# Patient Record
Sex: Male | Born: 1970 | Race: White | Hispanic: No | Marital: Married | Smoking: Former smoker
Health system: Southern US, Community
[De-identification: ages and names within clinical notes are randomized; demographics above are authoritative.]

## PROBLEM LIST (undated history)

## (undated) HISTORY — PX: HERNIA REPAIR: SHX51

---

## 2008-09-08 ENCOUNTER — Ambulatory Visit: Payer: Self-pay | Admitting: Surgery

## 2017-07-15 ENCOUNTER — Emergency Department: Payer: BLUE CROSS/BLUE SHIELD

## 2017-07-15 ENCOUNTER — Emergency Department
Admission: EM | Admit: 2017-07-15 | Discharge: 2017-07-16 | Disposition: A | Discharge status: Other Acute Inpatient Hospital | Payer: BLUE CROSS/BLUE SHIELD | Attending: Emergency Medicine | Admitting: Emergency Medicine

## 2017-07-15 ENCOUNTER — Encounter: Payer: Self-pay | Admitting: Emergency Medicine

## 2017-07-15 DIAGNOSIS — S022XXA Fracture of nasal bones, initial encounter for closed fracture: Secondary | ICD-10-CM | POA: Insufficient documentation

## 2017-07-15 DIAGNOSIS — S0121XA Laceration without foreign body of nose, initial encounter: Secondary | ICD-10-CM

## 2017-07-15 DIAGNOSIS — S0240DA Maxillary fracture, left side, initial encounter for closed fracture: Secondary | ICD-10-CM | POA: Insufficient documentation

## 2017-07-15 DIAGNOSIS — S0993XA Unspecified injury of face, initial encounter: Secondary | ICD-10-CM | POA: Diagnosis present

## 2017-07-15 DIAGNOSIS — Z79899 Other long term (current) drug therapy: Secondary | ICD-10-CM | POA: Diagnosis not present

## 2017-07-15 DIAGNOSIS — Z87891 Personal history of nicotine dependence: Secondary | ICD-10-CM | POA: Insufficient documentation

## 2017-07-15 DIAGNOSIS — S0232XA Fracture of orbital floor, left side, initial encounter for closed fracture: Secondary | ICD-10-CM | POA: Diagnosis not present

## 2017-07-15 DIAGNOSIS — Y929 Unspecified place or not applicable: Secondary | ICD-10-CM | POA: Diagnosis not present

## 2017-07-15 DIAGNOSIS — S0231XA Fracture of orbital floor, right side, initial encounter for closed fracture: Secondary | ICD-10-CM | POA: Diagnosis not present

## 2017-07-15 DIAGNOSIS — S0292XA Unspecified fracture of facial bones, initial encounter for closed fracture: Secondary | ICD-10-CM

## 2017-07-15 DIAGNOSIS — Y9389 Activity, other specified: Secondary | ICD-10-CM | POA: Insufficient documentation

## 2017-07-15 DIAGNOSIS — W11XXXA Fall on and from ladder, initial encounter: Secondary | ICD-10-CM | POA: Diagnosis not present

## 2017-07-15 DIAGNOSIS — Y999 Unspecified external cause status: Secondary | ICD-10-CM | POA: Diagnosis not present

## 2017-07-15 MED ORDER — ACETAMINOPHEN 500 MG PO TABS
1000.0000 mg | ORAL_TABLET | Freq: Once | ORAL | Status: AC
  Administered 2017-07-15: 1000 mg via ORAL
  Filled 2017-07-15: qty 2

## 2017-07-15 NOTE — ED Triage Notes (Addendum)
Pt arrives to ED 14 via EMS from home c/o mechanical fall and "face planted my nose on the bathtub" pt has an apparent injury to the nose, with bleeding that is controlled; per EMS, pt also experienced dizziness and orthostatic BP changes. Pt is awake, alert and oriented at this time. Per EMS pt has had a "few drinks" since about 1700 today. Pt c/o being "slightly dizzy" at this time. Pt states being on a 6-foot ladder when he fell and hit his face on the edge of the bathtub. Pt has no other apparent injuries to the head, neck, spine, back, torso, or the limbs.

## 2017-07-16 MED ORDER — LIDOCAINE HCL (PF) 1 % IJ SOLN
INTRAMUSCULAR | Status: AC
  Filled 2017-07-16: qty 10

## 2017-07-16 MED ORDER — LIDOCAINE HCL (PF) 1 % IJ SOLN
10.0000 mL | Freq: Once | INTRAMUSCULAR | Status: AC
  Administered 2017-07-16: 10 mL

## 2017-07-16 MED ORDER — LIDOCAINE HCL (PF) 1 % IJ SOLN
30.0000 mL | Freq: Once | INTRAMUSCULAR | Status: DC
  Filled 2017-07-16: qty 30

## 2017-07-16 NOTE — ED Notes (Signed)
EMTLA reviewed.

## 2017-07-16 NOTE — ED Notes (Signed)
[facility] at bedside; MD made aware.

## 2017-07-16 NOTE — ED Notes (Signed)
EMS arrived to pick up patient.

## 2017-07-16 NOTE — ED Notes (Signed)
Patient discharge and follow up information reviewed with patient by ED nursing staff and patient given the opportunity to ask questions pertaining to ED visit and discharge plan of care. Patient advised that should symptoms not continue to improve, resolve entirely, or should new symptoms develop then a follow up visit with their PCP or a return visit to the ED may be warranted. Patient verbalized consent and understanding of discharge plan of care including potential need for further evaluation. Patient being discharged in stable condition per attending ED physician on duty.   [facility], report called into Clance BollSamantha Gibson, Consulting civil engineerCharge RN. All pt's belongings with family and patient.

## 2017-07-16 NOTE — ED Provider Notes (Addendum)
[facility] Emergency Department Provider Note  ____________________________________________  Time seen: Approximately 12:37 AM  I have reviewed the triage vital signs and the nursing notes.   HISTORY  Chief Complaint Fall   HPI Gene Edwards. is a 46 y.o. male no significant past medical history who presents for evaluation of face trauma status post mechanical fall. Patient reports fall from 5 foot ladder while cleaning mold in the ceiling of hisbathroom when he lost balance and fell off the ladder face onto the bathtub. Patient is complaining of 4 out of 10 sharp/ throbbing pain in his nose and forehead. Patient denies LOC. He is not on any blood thinners. He denies neck pain, back pain, chest pain, extremity pain. Last tetanus shot is up-to-date.  History reviewed. No pertinent past medical history.  There are no active problems to display for this patient.   Past Surgical History:  Procedure Laterality Date  . HERNIA REPAIR      Prior to Admission medications   Medication Sig Start Date End Date Taking? Authorizing Provider  sildenafil (VIAGRA) 50 MG tablet Take 1 tablet by mouth as needed. 04/27/17  Yes [provider]    Allergies Sulfa antibiotics  Family History  Problem Relation Age of Onset  . Diabetes Father   . Hypertension Father     Social History Social History   Tobacco Use  . Smoking status: Former Smoker    Packs/day: 1.00    Types: Cigarettes    Last attempt to quit: 07/15/2016    Years since quitting: 1.0  . Smokeless tobacco: Never Used  Substance Use Topics  . Alcohol use: Yes    Alcohol/week: 3.6 oz    Types: 6 Standard drinks or equivalent per week  . Drug use: No    Review of Systems Constitutional: Negative for fever. Eyes: Negative for visual changes. ENT: + facial injury. No neck injury Cardiovascular: Negative for chest injury. Respiratory: Negative for shortness of breath. Negative for  chest wall injury. Gastrointestinal: Negative for abdominal pain or injury. Genitourinary: Negative for dysuria. Musculoskeletal: Negative for back injury, negative for arm or leg pain. Skin: Negative for laceration/abrasions. Neurological: Negative for head injury.  ____________________________________________   PHYSICAL EXAM:  VITAL SIGNS: ED Triage Vitals  Enc Vitals Group     BP 07/15/17 2323 125/81     Pulse Rate 07/15/17 2323 63     Resp 07/15/17 2323 12     Temp --      Temp src --      SpO2 07/15/17 2323 98 %     Weight 07/15/17 2324 165 lb (74.8 kg)     Height 07/15/17 2324 5\' 6"  (1.676 m)     Head Circumference --      Peak Flow --      Pain Score 07/15/17 2323 5     Pain Loc --      Pain Edu? --      Excl. in GC? --     Constitutional: Alert and oriented. No acute distress. Does not appear intoxicated. HEENT Head: Normocephalic and atraumatic. Face: No facial bony tenderness. Stable midface Ears: No hemotympanum bilaterally. No Battle sign Eyes: No eye injury. PERRL. No raccoon eyes Nose: Obvious deformity of the nose with overlying complex laceration. No active epistaxis. No rhinorrhea Mouth/Throat: Mucous membranes are moist. No oropharyngeal blood. No dental injury. Airway patent without stridor. Normal voice. Neck: no C-collar in place. No midline c-spine tenderness.  Cardiovascular: Normal  rate, regular rhythm. Normal and symmetric distal pulses are present in all extremities. Pulmonary/Chest: Chest wall is stable and nontender to palpation/compression. Normal respiratory effort. Breath sounds are normal. No crepitus.  Abdominal: Soft, nontender, non distended. Musculoskeletal: Nontender with normal full range of motion in all extremities. No deformities. No thoracic or lumbar midline spinal tenderness. Pelvis is stable. Skin: Skin is warm, dry and intact. No abrasions or contutions. Psychiatric: Speech and behavior are appropriate. Neurological: Normal  speech and language. Moves all extremities to command. No gross focal neurologic deficits are appreciated.  Glascow Coma Score: 4 - Opens eyes on own 6 - Follows simple motor commands 5 - Alert and oriented GCS: 15   ____________________________________________   LABS (all labs ordered are listed, but only abnormal results are displayed)  Labs Reviewed - No data to display ____________________________________________  EKG  ED ECG REPORT I, Nita Sicklearolina Twan Harkin, the attending physician, personally viewed and interpreted this ECG.  Normal sinus rhythm, rate of 63, normal intervals, normal axis, no ST elevations or depressions.  ____________________________________________  RADIOLOGY  CT head/ cspine/ face: 1. No acute intracranial nor cervical spine abnormality. 2. Cervical spondylosis without acute posttraumatic cervical spine fracture or subluxation. 3. Acute depressed bilateral nasal bone fractures with slight comminution and overlying soft tissue swelling. Fracture of the nasal septum. 4. Bilateral orbital roof fractures with extra and intraconal soft tissue emphysema on the left and extraconal air on the right. 5. Bilateral fractures of the lamina papyracea of both orbits involving the medial walls. 6. Complex slightly depressed anterior frontal sinus wall fracture with associated hemorrhage within the frontal sinus. 7. Left anterior maxillary sinus wall fracture with slight buckling. ____________________________________________   PROCEDURES  Procedure(s) performed: None Procedures Critical Care performed:  None ____________________________________________   INITIAL IMPRESSION / ASSESSMENT AND PLAN / ED COURSE  46 y.o. male no significant past medical history who presents for evaluation of face trauma status post mechanical fall. no LOC, no blood thinners, neurologically intact, GCS of 15, obvious deformity of the nose with no active epistaxis and an overlying  laceration. No signs of basilar skull fracture. Tetanus up to date. CT concerning for several facial fractures. Discussed with Dr. Alvino ChapelHOI, ENT on call who recommended transfer to emergent ENT evaluation tonight. Will consult [facility]    _________________________ 1:45 AM on 07/16/2017 -----------------------------------------  Patient [facility] [place] [facility]. Remains stable, GCS 15. Patient refused repair of his nose laceration here because he wishes that it [facility]plastic [facility]. I explained to him that it may not be [facility] and also that delaying closure will increase chances on infection and worsening scaring.   As part of my medical decision making, I reviewed the following data within the electronic MEDICAL RECORD NUMBER Nursing notes reviewed and incorporated, Radiograph reviewed , A consult was requested and obtained from this/these consultant(s) ENT, Notes from prior ED visits and [place] Controlled Substance Database    Pertinent labs & imaging results that were available during my care of the patient were reviewed by me and considered in my medical decision making (see chart for details).    ____________________________________________   FINAL CLINICAL IMPRESSION(S) / ED DIAGNOSES  Final diagnoses:  Fall from ladder, initial encounter  Multiple closed fractures of facial bone, initial encounter (HCC)  Laceration of nose, initial encounter      NEW MEDICATIONS STARTED DURING THIS VISIT:  ED Discharge Orders  None       Note:  This document was prepared using Dragon voice recognition software and may include unintentional dictation errors.    Nita SickleVeronese, [place], MD 07/16/17 0145    Nita SickleVeronese, [place], MD 07/16/17 [phone]

## 2018-07-18 ENCOUNTER — Other Ambulatory Visit: Payer: Self-pay

## 2018-07-18 ENCOUNTER — Ambulatory Visit
Admission: EM | Admit: 2018-07-18 | Discharge: 2018-07-18 | Disposition: A | Discharge status: Home / Self Care | Payer: BLUE CROSS/BLUE SHIELD | Attending: Emergency Medicine | Admitting: Emergency Medicine

## 2018-07-18 DIAGNOSIS — J0140 Acute pansinusitis, unspecified: Secondary | ICD-10-CM | POA: Diagnosis not present

## 2018-07-18 MED ORDER — FLUTICASONE PROPIONATE 50 MCG/ACT NA SUSP: 2.0000 | Freq: Every day | NASAL | 0 refills | Status: AC

## 2018-07-18 MED ORDER — IBUPROFEN 600 MG PO TABS: 600.0000 mg | ORAL_TABLET | Freq: Four times a day (QID) | ORAL | 0 refills | Status: AC | PRN

## 2018-07-18 MED ORDER — DOXYCYCLINE HYCLATE 100 MG PO CAPS: 100.0000 mg | ORAL_CAPSULE | Freq: Two times a day (BID) | ORAL | 0 refills | Status: AC

## 2018-07-18 NOTE — ED Provider Notes (Signed)
HPI  SUBJECTIVE:  Gene Edwards. is a 47 y.o. male who presents with worsening URI symptoms for the past week.  Reports nasal congestion, rhinorrhea, postnasal drip, sinus pain and pressure, headaches.  Reports a cough productive of the same material as his nasal congestion.  Reports bilateral ear pressure and a bad taste in the back of his throat.  Had a sore throat the first day, but this has since resolved.  No fevers, upper dental pain, wheezing, chest pain, shortness of breath.  He tried ibuprofen 400 mg with improvement in symptoms.  Symptoms are worse with bending forward, lying down.  No antibiotics in the past month.  No antipyretic in the past 4 to 6 hours.  He has a past medical history of multiple facial fractures status post fall.  States that he was told that he would be at increased risk for sinusitis.  No history of diabetes, hypertension, frequent sinusitis.  No history of asthma, eczema, COPD.  He is a former smoker.  PMD: Rosemarie Ax.  History reviewed. No pertinent past medical history.  Past Surgical History:  Procedure Laterality Date  . HERNIA REPAIR      Family History  Problem Relation Age of Onset  . Diabetes Father   . Hypertension Father     Social History   Tobacco Use  . Smoking status: Former Smoker    Packs/day: 1.00    Types: Cigarettes    Last attempt to quit: 07/15/2016    Years since quitting: 2.0  . Smokeless tobacco: Never Used  Substance Use Topics  . Alcohol use: Yes    Alcohol/week: 6.0 standard drinks    Types: 6 Standard drinks or equivalent per week  . Drug use: No    No current facility-administered medications for this encounter.   Current Outpatient Medications:  .  sildenafil (VIAGRA) 50 MG tablet, Take 1 tablet by mouth as needed., Disp: , Rfl: 1 .  doxycycline (VIBRAMYCIN) 100 MG capsule, Take 1 capsule (100 mg total) by mouth 2 (two) times daily for 7 days., Disp: 14 capsule, Rfl: 0 .  fluticasone (FLONASE) 50  MCG/ACT nasal spray, Place 2 sprays into both nostrils daily., Disp: 16 g, Rfl: 0 .  ibuprofen (ADVIL,MOTRIN) 600 MG tablet, Take 1 tablet (600 mg total) by mouth every 6 (six) hours as needed., Disp: 30 tablet, Rfl: 0  Allergies  Allergen Reactions  . Sulfa Antibiotics Nausea Only     ROS  As noted in HPI.   Physical Exam  BP (!) 160/95 (BP Location: Left Arm)   Pulse 67   Temp 98.3 F (36.8 C) (Oral)   Resp 18   Ht 5\' 6"  (1.676 m)   Wt 74.8 kg   SpO2 100%   BMI 26.63 kg/m   Constitutional: Well developed, well nourished, no acute distress Eyes:  EOMI, conjunctiva normal bilaterally HENT: Normocephalic, atraumatic,mucus membranes moist.  TMs normal bilaterally.  Positive maxillary and frontal sinus tenderness.  Positive erythematous, swollen turbinates, purulent nasal congestion.  Unable to completely visualize posterior oropharynx. Respiratory: Normal inspiratory effort lungs clear bilaterally, good air movement Cardiovascular: Normal rate regular rhythm, no murmurs, rubs, gallops GI: nondistended skin: No rash, skin intact Musculoskeletal: no deformities Neurologic: Alert & oriented x 3, no focal neuro deficits Psychiatric: Speech and behavior appropriate   ED Course   Medications - No data to display  No orders of the defined types were placed in this encounter.   No results found for this or  any previous visit (from the past 24 hour(s)). No results found.  ED Clinical Impression  Acute non-recurrent pansinusitis   ED Assessment/Plan  Because the patient is reporting getting worse and he does have a history of multiple facial fractures I think that he is at high risk for sinusitis that would require antibiotic treatment.  Advised Mucinex D, saline nasal irrigation with a Lloyd HugerNeil med rinse and distilled water as often as he wants, will start Flonase, doxycycline, ibuprofen 600 mg take with 1 g of Tylenol.  Advised him that he does not need to take any other  combination cold medicines.  Follow-up with PMD as needed.  Discussed MDM, treatment plan, and plan for follow-up with patient. . patient agrees with plan.   Meds ordered this encounter  Medications  . ibuprofen (ADVIL,MOTRIN) 600 MG tablet    Sig: Take 1 tablet (600 mg total) by mouth every 6 (six) hours as needed.    Dispense:  30 tablet    Refill:  0  . doxycycline (VIBRAMYCIN) 100 MG capsule    Sig: Take 1 capsule (100 mg total) by mouth 2 (two) times daily for 7 days.    Dispense:  14 capsule    Refill:  0  . fluticasone (FLONASE) 50 MCG/ACT nasal spray    Sig: Place 2 sprays into both nostrils daily.    Dispense:  16 g    Refill:  0    *This clinic note was created using Scientist, clinical (histocompatibility and immunogenetics)Dragon dictation software. Therefore, there may be occasional mistakes despite careful proofreading.   ?    Domenick GongMortenson, Antia Rahal, MD 07/18/18 1315

## 2018-07-18 NOTE — Discharge Instructions (Addendum)
Take the medication as written. Start Mucinex-D to keep the mucous thin and to decongest you.  You may take 600 mg of motrin with 1 gram of tylenol up to 3-4 times a day as needed for pain. This is an effective combination for pain.  Most sinus infections are viral and do not need antibiotics unless you have a high fever, have had this for 10 days, or you get better and then get sick again. Use a NeilMed sinus rinse as often as you want to to reduce nasal congestion. Follow the directions on the box.   Go to www.goodrx.com to look up your medications. This will give you a list of where you can find your prescriptions at the most affordable prices. Or you can ask the pharmacist what the cash price is. This is frequently cheaper than going through insurance.   

## 2018-07-18 NOTE — ED Triage Notes (Signed)
Patient complains of sinus pain and pressure that started 1 week ago. Patient states that pain has been worsening. States that 1 year ago he fell off a ladder and had multiple facial fractures.

## 2019-03-15 IMAGING — CT CT HEAD W/O CM
2 of 12 series · 10 of 47 positions shown, 12 images · non-contrast
Comparison: None.

CLINICAL DATA: Patient fell, landing on nose. Orthostatics blood
pressure change and dizziness.

EXAM:
CT HEAD WITHOUT CONTRAST
CT MAXILLOFACIAL WITHOUT CONTRAST
CT CERVICAL SPINE WITHOUT CONTRAST
TECHNIQUE: Multidetector CT imaging of the head, cervical spine, and
maxillofacial structures were performed using the standard protocol
without intravenous contrast. Multiplanar CT image reconstructions
of the cervical spine and maxillofacial structures were also
generated.

[Series 10: coronal soft · coronal · 0.35mm/px · 2 of 102 slices shown]
[im 34/102  brain]
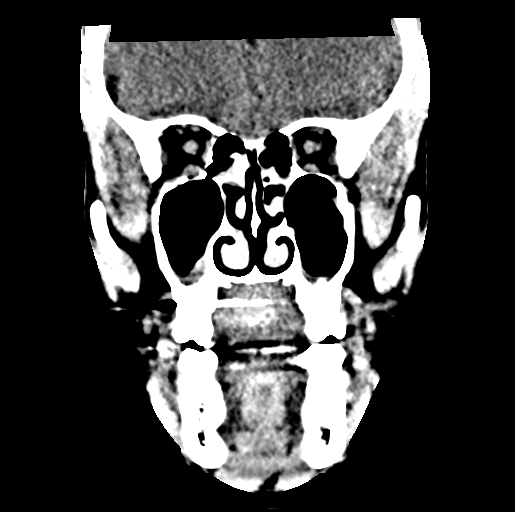
[im 68/102  brain]
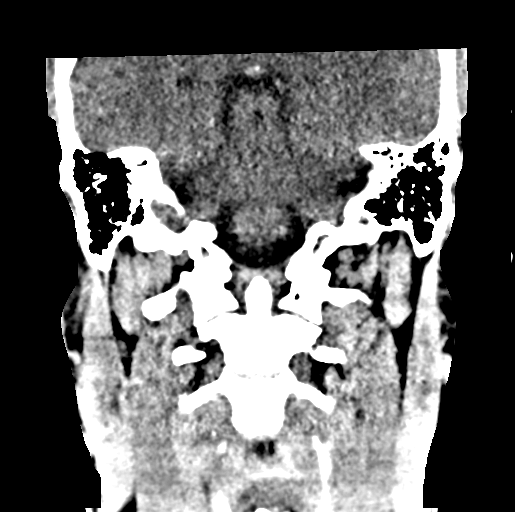

[Series 18: orthogonal axials · axial · 0.24mm/px · z∈[-343,-201]mm · 8 of 104 slices shown, 10 images]
[im 12/104  brain]
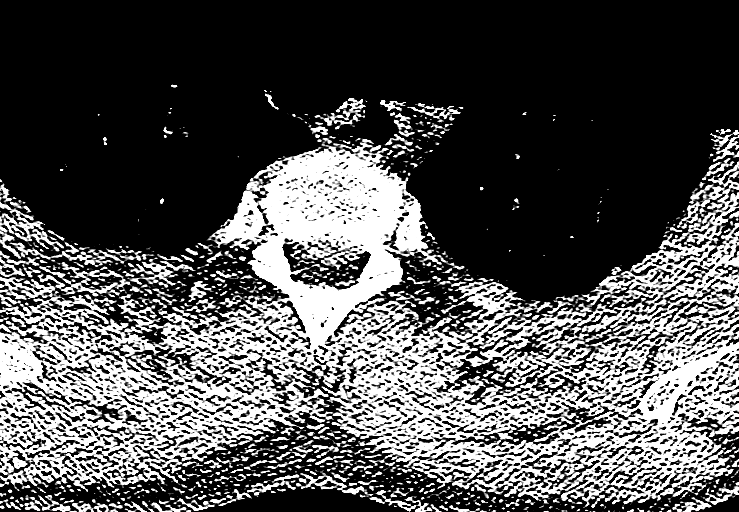
[im 12/104  bone]
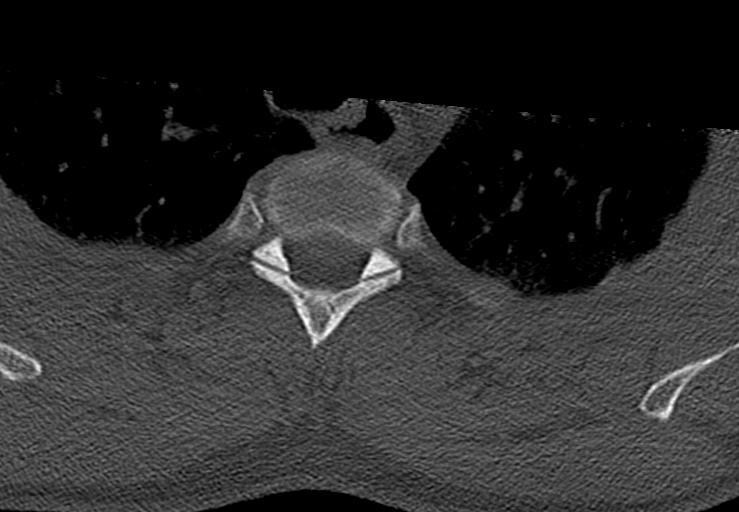
[im 23/104  brain]
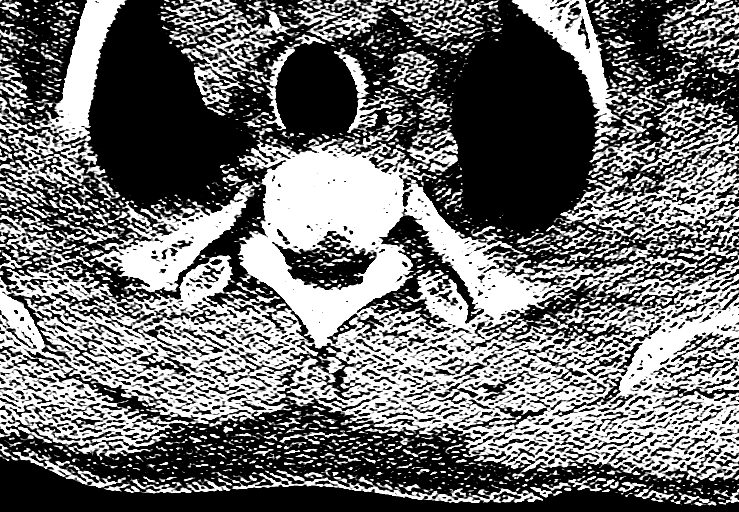
[im 35/104  brain]
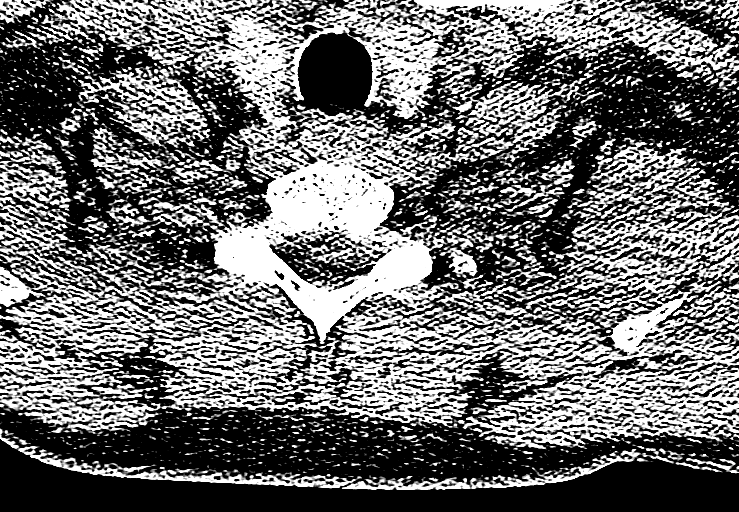
[im 46/104  brain]
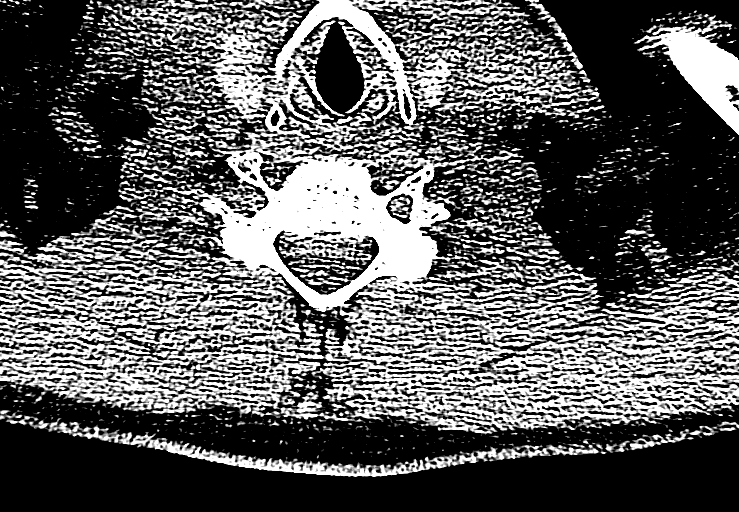
[im 58/104  brain]
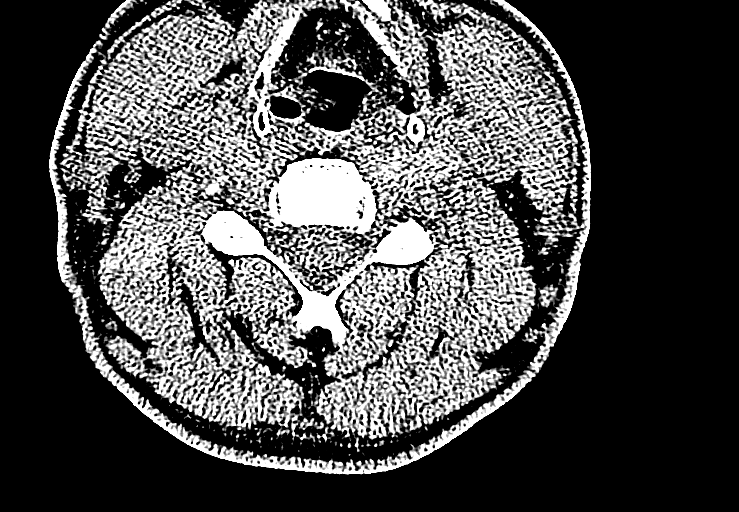
[im 58/104  bone]
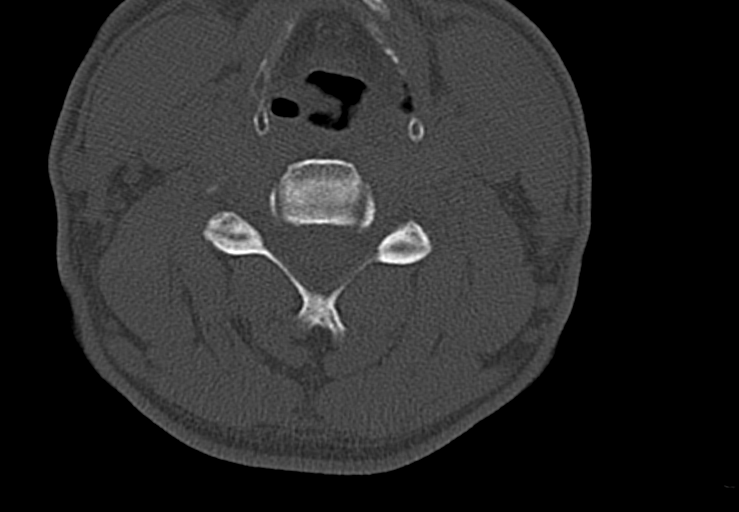
[im 69/104  brain]
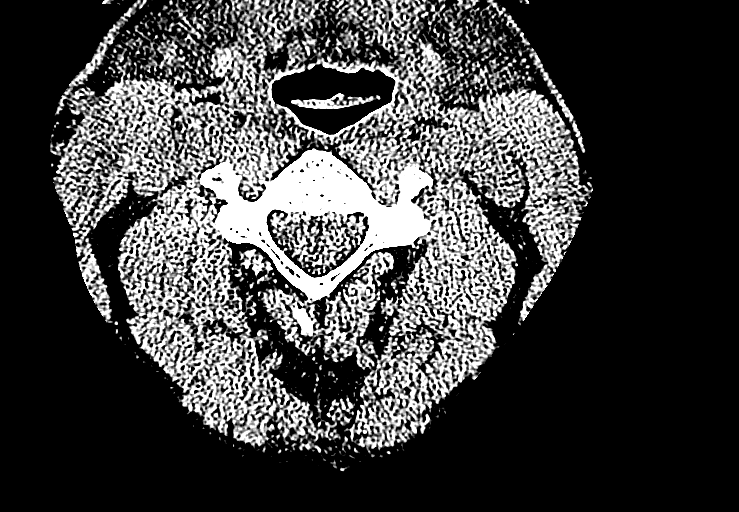
[im 81/104  brain]
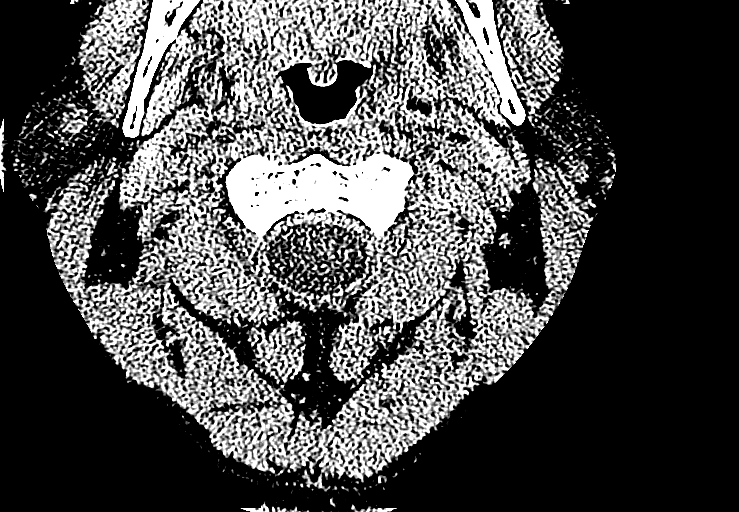
[im 92/104  brain]
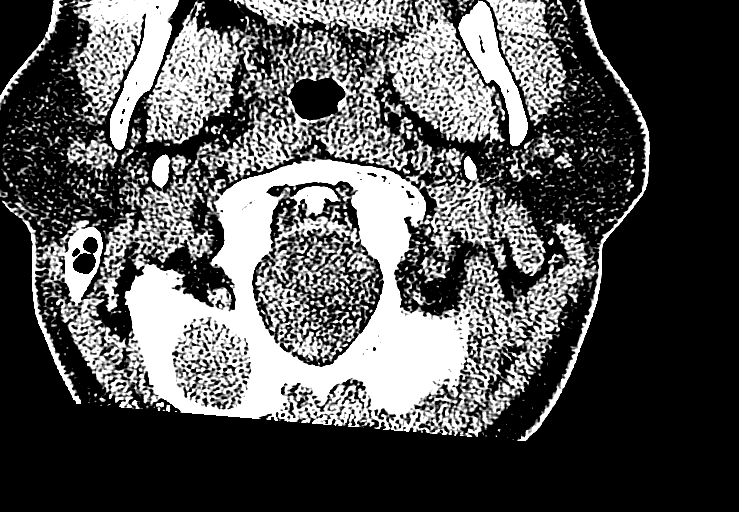

[10 of 47 positions shown; findings below may reference images not displayed]

FINDINGS: CT HEAD FINDINGS

Brain: No evidence of acute infarction, hemorrhage, hydrocephalus,
extra-axial collection or mass lesion/mass effect.

Vascular: No hyperdense vessel or unexpected calcification.

Skull: The bony calvarium is intact. Please see the facial CT report
below for better detail of maxillofacial fractures partially
included on this head CT.

Other: Nasal and perinasal soft tissue swelling.

CT MAXILLOFACIAL FINDINGS

Osseous: Acute, depressed bilateral nasal bone fractures with slight
comminution and overlying soft tissue swelling. Fracture of the
nasal septum is noted along the midportion and anteriorly. No
mandibular fracture. Temporomandibular joints are maintained
bilaterally.

Orbits: Bilateral orbital roof fractures with extra and intraconal
soft tissue emphysema on the left and extraconal on the right.
Buckling of the medial wall/lamina papyracea of both orbits more
posteriorly on the right in anteriorly on the left. No extraocular
muscle or optic nerve entrapment.

Sinuses: Hyperdense complex fluid in the frontal sinus with
air-fluid level consistent hemorrhage is noted within the frontal
sinus secondary to a comminuted depressed anterior frontal sinus
wall fracture. Left anterior maxillary sinus wall fracture.

Soft tissues: Soft tissue opacification of much of the ethmoid
sinus. Chronic circumferential mucosal thickening of the maxillary
sinuses. Sphenoid sinus demonstrates mild mucosal thickening.

CT CERVICAL SPINE FINDINGS

Alignment: Slight reversal cervical lordosis. Intact craniocervical
relationship.

Skull base and vertebrae: No acute fracture. No primary bone lesion
or focal pathologic process.

Soft tissues and spinal canal: No prevertebral fluid or swelling. No
visible canal hematoma.

Disc levels: Mild-to-moderate disc space narrowing C5 through T2. No
significant central or neural foraminal encroachment. No jumped or
perched facets.

Upper chest: Clear

Other: None
IMPRESSION: 1. No acute intracranial nor cervical spine abnormality.
2. Cervical spondylosis without acute posttraumatic cervical spine
fracture or subluxation.
3. Acute depressed bilateral nasal bone fractures with slight
comminution and overlying soft tissue swelling. Fracture of the
nasal septum.
4. Bilateral orbital roof fractures with extra and intraconal soft
tissue emphysema on the left and extraconal air on the right.
5. Bilateral fractures of the lamina papyracea of both orbits
involving the medial walls.
6. Complex slightly depressed anterior frontal sinus wall fracture
with associated hemorrhage within the frontal sinus.
7. Left anterior maxillary sinus wall fracture with slight buckling.
